# Patient Record
Sex: Male | Born: 2005 | Race: Black or African American | Hispanic: No | Marital: Single | Smoking: Never smoker
Health system: Southern US, Community
[De-identification: ages and names within clinical notes are randomized; demographics above are authoritative.]

## PROBLEM LIST (undated history)

## (undated) DIAGNOSIS — J353 Hypertrophy of tonsils with hypertrophy of adenoids: Secondary | ICD-10-CM

## (undated) DIAGNOSIS — R062 Wheezing: Secondary | ICD-10-CM

## (undated) HISTORY — PX: CIRCUMCISION: SUR203

## (undated) HISTORY — PX: TYMPANOSTOMY TUBE PLACEMENT: SHX32

---
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
## 2024-02-07 ENCOUNTER — Other Ambulatory Visit (HOSPITAL_COMMUNITY): Payer: Self-pay | Admitting: Physician Assistant

## 2024-02-07 DIAGNOSIS — F4010 Social phobia, unspecified: Secondary | ICD-10-CM

## 2024-02-07 DIAGNOSIS — F331 Major depressive disorder, recurrent, moderate: Secondary | ICD-10-CM
